# Patient Record
Sex: Female | Born: 1985 | Hispanic: No | Marital: Single | Smoking: Never smoker
Health system: Southern US, Community
[De-identification: ages and names within clinical notes are randomized; demographics above are authoritative.]

## PROBLEM LIST (undated history)

## (undated) DIAGNOSIS — F410 Panic disorder [episodic paroxysmal anxiety] without agoraphobia: Secondary | ICD-10-CM

## (undated) DIAGNOSIS — F329 Major depressive disorder, single episode, unspecified: Secondary | ICD-10-CM

## (undated) DIAGNOSIS — F32A Depression, unspecified: Secondary | ICD-10-CM

## (undated) HISTORY — PX: APPENDECTOMY: SHX54

## (undated) HISTORY — DX: Depression, unspecified: F32.A

## (undated) HISTORY — DX: Major depressive disorder, single episode, unspecified: F32.9

---

## 2013-12-17 ENCOUNTER — Emergency Department (HOSPITAL_COMMUNITY)
Admission: EM | Admit: 2013-12-17 | Discharge: 2013-12-17 | Disposition: A | Discharge status: Home / Self Care | Payer: Managed Care, Other (non HMO) | Attending: Emergency Medicine | Admitting: Emergency Medicine

## 2013-12-17 ENCOUNTER — Encounter (HOSPITAL_COMMUNITY): Payer: Self-pay | Admitting: Emergency Medicine

## 2013-12-17 ENCOUNTER — Emergency Department (HOSPITAL_COMMUNITY): Payer: Managed Care, Other (non HMO)

## 2013-12-17 DIAGNOSIS — Z8659 Personal history of other mental and behavioral disorders: Secondary | ICD-10-CM | POA: Insufficient documentation

## 2013-12-17 DIAGNOSIS — R51 Headache: Secondary | ICD-10-CM | POA: Insufficient documentation

## 2013-12-17 DIAGNOSIS — Z3202 Encounter for pregnancy test, result negative: Secondary | ICD-10-CM | POA: Insufficient documentation

## 2013-12-17 DIAGNOSIS — G8929 Other chronic pain: Secondary | ICD-10-CM | POA: Insufficient documentation

## 2013-12-17 DIAGNOSIS — R6884 Jaw pain: Secondary | ICD-10-CM | POA: Insufficient documentation

## 2013-12-17 DIAGNOSIS — R079 Chest pain, unspecified: Secondary | ICD-10-CM

## 2013-12-17 DIAGNOSIS — R0789 Other chest pain: Secondary | ICD-10-CM | POA: Insufficient documentation

## 2013-12-17 DIAGNOSIS — R002 Palpitations: Secondary | ICD-10-CM | POA: Insufficient documentation

## 2013-12-17 DIAGNOSIS — R0602 Shortness of breath: Secondary | ICD-10-CM | POA: Insufficient documentation

## 2013-12-17 HISTORY — DX: Panic disorder (episodic paroxysmal anxiety): F41.0

## 2013-12-17 LAB — BASIC METABOLIC PANEL
BUN: 12 mg/dL (ref 6–23)
CHLORIDE: 102 meq/L (ref 96–112)
CO2: 27 mEq/L (ref 19–32)
CREATININE: 0.64 mg/dL (ref 0.50–1.10)
Calcium: 10.5 mg/dL (ref 8.4–10.5)
GFR calc Af Amer: >90 mL/min (ref >90)
GFR calc non Af Amer: >90 mL/min (ref >90)
Glucose, Bld: 94 mg/dL (ref 70–99)
Potassium: 4.5 mEq/L (ref 3.7–5.3)
Sodium: 141 mEq/L (ref 137–147)

## 2013-12-17 LAB — POCT I-STAT TROPONIN I: TROPONIN I, POC: 0.01 ng/mL (ref 0.00–0.08)

## 2013-12-17 LAB — CBC
HEMATOCRIT: 38.5 % (ref 36.0–46.0)
Hemoglobin: 12.8 g/dL (ref 12.0–15.0)
MCH: 29.5 pg (ref 26.0–34.0)
MCHC: 33.2 g/dL (ref 30.0–36.0)
MCV: 88.7 fL (ref 78.0–100.0)
Platelets: 279 10*3/uL (ref 150–400)
RBC: 4.34 MIL/uL (ref 3.87–5.11)
RDW: 13.8 % (ref 11.5–15.5)
WBC: 6.8 10*3/uL (ref 4.0–10.5)

## 2013-12-17 LAB — POCT PREGNANCY, URINE: Preg Test, Ur: NEGATIVE

## 2013-12-17 LAB — D-DIMER, QUANTITATIVE: D-Dimer, Quant: <0.27 ug/mL-FEU (ref 0.00–0.48)

## 2013-12-17 NOTE — ED Provider Notes (Signed)
CSN: [id number]     Arrival date & time 12/17/13  0807 History   First MD Initiated Contact with Patient 12/17/13 [phone]     Chief Complaint  Patient presents with  . Chest Pain  . Shortness of Breath     (Consider location/radiation/quality/duration/timing/severity/associated sxs/prior Treatment) HPI Comments: 28 year old female presents with 2 weeks of intermittent chest pain and shortness of breath. She states the pain is in her left upper chest and feels like a sharp stabbing pain. He comes on about 10 minutes and then is completely resolved. This happens a couple times a day the past couple weeks nearly every day. She states this morning she woke up with palpitations and had palpitations yesterday associated with these symptoms. She also felt like the pain was in her jaw. The symptoms have now resolved. She's not had any cough, fever, or rhinorrhea. She has not been recently ill. She's never had a history of a blood clot, has no leg pain or no leg swelling. Denies weight changes. No family history of cardiac disease. No prior medical problems with the patient and his never smoked. No recent travel to outside countries.   Past Medical History  Diagnosis Date  . Panic attack    History reviewed. No pertinent past surgical history. No family history on file. History  Substance Use Topics  . Smoking status: Never Smoker   . Smokeless tobacco: Not on file  . Alcohol Use: No   OB History   Grav Para Term Preterm Abortions TAB SAB Ect Mult Living                 Review of Systems  Constitutional: Negative for fever, fatigue and unexpected weight change.  HENT: Negative for congestion and rhinorrhea.   Respiratory: Positive for shortness of breath. Negative for cough.   Cardiovascular: Positive for chest pain. Negative for leg swelling.  Gastrointestinal: Negative for abdominal pain.  Neurological: Positive for headaches (chronic headaches, none now).  All other systems reviewed and  are negative.      Allergies  Review of patient's allergies indicates no known allergies.  Home Medications   Current Outpatient Rx  Name  Route  Sig  Dispense  Refill  . acetaminophen (TYLENOL) 325 MG tablet   Oral   Take 650 mg by mouth every 6 (six) hours as needed for mild pain.          BP 147/70  Pulse 80  Temp(Src) 98 F (36.7 C) (Oral)  Resp 17  SpO2 100%  LMP 12/13/2013 Physical Exam  Nursing note and vitals reviewed. Constitutional: She is oriented to person, place, and time. She appears well-developed and well-nourished. No distress.  HENT:  Head: Normocephalic and atraumatic.  Right Ear: External ear normal.  Left Ear: External ear normal.  Nose: Nose normal.  Eyes: Right eye exhibits no discharge. Left eye exhibits no discharge.  Cardiovascular: Normal rate, regular rhythm and normal heart sounds.   No murmur heard. Pulmonary/Chest: Effort normal and breath sounds normal. She exhibits no tenderness.  Abdominal: Soft. She exhibits no distension. There is no tenderness.  Musculoskeletal: She exhibits no edema and no tenderness.  No signs of asymmetric leg swelling or tenderness  Neurological: She is alert and oriented to person, place, and time.  Skin: Skin is warm and dry.    ED Course  Procedures (including critical care time) Labs Review Labs Reviewed  CBC  BASIC METABOLIC PANEL  D-DIMER, QUANTITATIVE   Imaging Review Dg Chest 2  View  12/17/2013   CLINICAL DATA:  Chest pain and shortness of breath. Left upper anterior and posterior chest pain for 2 weeks.  EXAM: CHEST  2 VIEW  COMPARISON:  None.  FINDINGS: The cardiomediastinal silhouette is within normal limits. The lungs are well inflated and clear. There is no evidence of pleural effusion or pneumothorax. No acute osseous abnormality is identified.  IMPRESSION: Unremarkable appearance of the chest.   Electronically Signed   By: Sebastian AcheAllen  Grady   On: 12/17/2013 09:11    EKG Interpretation     Date/Time:  Wednesday December 17 2013 08:15:54 EST Ventricular Rate:  74 PR Interval:  143 QRS Duration: 85 QT Interval:  368 QTC Calculation: 408 R Axis:   91 Text Interpretation:  Sinus rhythm Borderline right axis deviation Baseline wander in lead(s) II III aVR aVF V1 V2 V4 V5 V6 given the wander, no acute ischemia noted No old tracing to compare Confirmed by Jeren Dufrane  MD, Tiki Tucciarone ([id number]) on 12/17/2013 8:23:11 AM            MDM   Final diagnoses:  Chest pain  Palpitations    Patient is well appearing here, no current complaints. Very low risk for ACS, and with benign EKG and troponin here I feel this is excluded. She did recently move from [place] so there is some concern for PE, ddimer sent bc she is low risk. Given palpitations she will need further w/u and possibly holter monitor. Will recommend getting a new PCP in this area for this. No outward signs of thyroid disease on exam.     Audree CamelScott T Idalia Allbritton, MD 12/17/13 1055

## 2013-12-17 NOTE — ED Notes (Signed)
Pt reports left sided CP/SOB for 2 weeks. Awoke yesterday with radiating pain to jaw, neck and back pain lasting 10 minutes. Nothing made it stop. HX of panic attacks only. No other complaints

## 2013-12-17 NOTE — Discharge Instructions (Signed)
Your caregiver has diagnosed you as having chest pain that is not specific for one problem, but does not require admission.  You are at low risk for an acute heart condition or other serious illness. Chest pain comes from many different causes.  °SEEK IMMEDIATE MEDICAL ATTENTION IF: °You have severe chest pain, especially if the pain is crushing or pressure-like and spreads to the arms, back, neck, or jaw, or if you have sweating, nausea (feeling sick to your stomach), or shortness of breath. THIS IS AN EMERGENCY. Don't wait to see if the pain will go away. Get medical help at once. Call 911 or 0 (operator). DO NOT drive yourself to the hospital.  °Your chest pain gets worse and does not go away with rest.  °You have an attack of chest pain lasting longer than usual, despite rest and treatment with the medications your caregiver has prescribed.  °You wake from sleep with chest pain or shortness of breath.  °You feel dizzy or faint.  °You have chest pain not typical of your usual pain for which you originally saw your caregiver. ° ° °Emergency Department Resource Guide °1) Find a Doctor and Pay Out of Pocket °Although you won't have to find out who is covered by your insurance plan, it is a good idea to ask around and get recommendations. You will then need to call the office and see if the doctor you have chosen will accept you as a new patient and what types of options they offer for patients who are self-pay. Some doctors offer discounts or will set up payment plans for their patients who do not have insurance, but you will need to ask so you aren't surprised when you get to your appointment. ° °2) Contact Your Local Health Department °Not all health departments have doctors that can see patients for sick visits, but many do, so it is worth a call to see if yours does. If you don't know where your local health department is, you can check in your phone book. The CDC also has a tool to help you locate your state's  health department, and many state websites also have listings of all of their local health departments. ° °3) Find a Walk-in Clinic °If your illness is not likely to be very severe or complicated, you may want to try a walk in clinic. These are popping up all over the country in pharmacies, drugstores, and shopping centers. They're usually staffed by nurse practitioners or physician assistants that have been trained to treat common illnesses and complaints. They're usually fairly quick and inexpensive. However, if you have serious medical issues or chronic medical problems, these are probably not your best option. ° °No Primary Care Doctor: °- Call [facility] at  832-8000 - they can help you locate a primary care doctor that  accepts your insurance, provides certain services, etc. °- Physician Referral Service- 1-800-533-3463 ° °Chronic Pain Problems: °Organization         Address  Phone   Notes  °[facility]  (336) 297-2271 Patients need to be referred by their primary care doctor.  ° °Medication Assistance: °Organization         Address  Phone   Notes  °[place] [facility] [address] °[place], [place] [address] (336) 641-8030 --Must be a resident of [place] °-- Must have NO insurance coverage whatsoever (no Medicaid/ Medicare, etc.) °-- The pt. MUST have a primary care doctor that   directs their care regularly and follows them in the community °  °[facility]  (866) 331-1348   °[employer]  (888) 892-1162   ° °Agencies that provide inexpensive medical care: °Organization         Address  Phone   Notes  °[facility]  (336) 832-8035   °[facility]    (336) 832-7272   °[facility] [address] °[place], [place] [address] (336) 832-4777   °[facility] [address], °[place] (336) 271-4999   °[facility]    (336) 373-0678   °[facility]    (336) 272-1050    °[facility] ° [address], [place] Phone:  (336) 832-4444, Fax:  (336) 832-4440 Hours of Operation:  9 am - 6 pm, M-F.  Also accepts Medicaid/Medicare and self-pay.  °[facility] ° [address], [place] Phone: (336) 832-3150, Fax: (336) 832-3151. Hours of Operation:  8:30 am - 5:30 pm, M-F.  Also accepts Medicaid and self-pay.  °[facility] [place] [address], [place] Phone: (336) 878-6027   °[facility] [address], [place], [place] (336)723-1848, Ext. 123 Mondays & Thursdays: 7-9 AM.  First 15 patients are seen on a first come, first serve basis. °  ° °Medicaid-accepting [place] Providers: ° °Organization         Address  Phone   Notes  °[facility] [address], [place] (336) 641-2100 Also accepts self-pay patients.  °[facility] [address], [place] ° (336) 856-9996   °[facility] [address], [place] (336) 288-8857   °[facility] [address] [place] [address], [place] (336) 299-7000   °[facility] [address], [place]  ° (336) 373-1557 Only accepts [place] Access Medicaid patients after they have their name applied to their card.  ° °Self-Pay (no insurance) in [place]: ° °Organization         Address  Phone   Notes  °Sickle Cell Patients, [facility] [address], [place] (336) 832-1970   °[facility] [address], [place] (336) 832-4400   °[facility] ° [address], [place] (336) 992-4800   °[facility]/Dr. Osei-Bonsu ° [address] [place] [address], [place] or [address], [place] (336) 841-8500 Phone number for both [place] and [place] locations is the same.  °[facility] [address], [place] (336) 299-0000     °[facility] [address] [place] [address], [place] or [address] (336) 852-7530 °(336) 878-2260   °[facility] [address], [place] (336) 350-1642, phone; (336) 294-5005, fax Sees patients 1st and 3rd Saturday of every month.  Must not qualify for public or private insurance (i.e. Medicaid, Medicare, [place] Health Choice, Veterans' Benefits) • Household income should be no more than 200% of the poverty level •The clinic cannot treat you if you are pregnant or think you are pregnant • Sexually transmitted diseases are not treated at the clinic.  ° ° °Dental Care: °Organization         Address  Phone  Notes  °[facility] [address], [place] (336) 641-6152 Accepts children   up to age 21 who are enrolled in Medicaid or [place] Health Choice; pregnant women with a Medicaid card; and children who have applied for Medicaid or [place] Health Choice, but were declined, whose parents can pay a reduced fee at time of service.  °[facility] [place]  [address], [place] (336) 641-7733 Accepts children up to age 21 who are enrolled in Medicaid or [place] Health Choice; pregnant women with a Medicaid card; and children who have applied for Medicaid or [place] Health Choice, but were declined, whose parents can pay a reduced fee at time of service.  °[facility] ° [address], [place] (336) 641-4533 Patients are seen by appointment only. Walk-ins are not accepted. [facility] will see patients 18 years of age and older. °Monday - Tuesday (8am-5pm) °Most Wednesdays (8:30-5pm) °$30 per visit, cash only  °[facility] ° [address], [place] (336) 641-4533 Patients are seen by appointment only. Walk-ins are not accepted. [facility] will see patients 18 years of age and older. °One Wednesday Evening (Monthly: Volunteer Based).   $30 per visit, cash only  °[facility]  (919) 537-3737 for adults; Children under age 4, call Graduate Pediatric Dentistry at (919) [phone]. Children aged 4-14, please call (919) [phone] to request a pediatric application. ° Dental services are provided in all areas of dental care including fillings, crowns and bridges, complete and partial dentures, implants, gum treatment, root canals, and extractions. Preventive care is also provided. Treatment is provided to both adults and children. °Patients are selected via a lottery and there is often a waiting list. °  °[facility] [address], °[place] ° (336) 763-8833 [email or web address] °  °[facility] [address], [place], [place] (336)723-1848, Ext. 123 Second and Fourth Thursday of each month, opens at 6:30 AM; Clinic ends at 9 AM.  Patients are seen on a first-come first-served basis, and a limited number are seen during each clinic.  ° °[facility] ° [address], [place], [place] (336) 723-7904   Eligibility Requirements °You must have lived in [place], [place], or [place] counties for at least the last three months. °  You cannot be eligible for state or federal sponsored healthcare insurance, including Veterans Administration, Medicaid, or Medicare. °  You generally cannot be eligible for healthcare insurance through your employer.  °  How to apply: °Eligibility screenings are held every Tuesday and Wednesday afternoon from 1:00 pm until 4:00 pm. You do not need an appointment for the interview!  °[facility] [address], [place], [place] 336-631-2330   °[facility]  336-342-8273   °[facility]  336-703-3100   °[facility]  336-570-6415   ° °Behavioral Health Resources in the Community: °Intensive Outpatient Programs °Organization         Address  Phone  Notes  °[facility] [address], [place], [place] 336-878-6098   °[facility] [address], [place], [place] 336-832-9800   °[facility] [address], [place], [place] ° 336-882-2125   °[facility] [address],  °, [place] 1-800-853-5163 or 336-641-4981   °Substance Abuse Resources °Organization         Address  Phone  Notes  °[facility]  336-882-2125   °[facility]  336-784-9470   °[facility]  336-285-9073   °[facility]  336-845-3988   °Residential & Outpatient Substance Abuse Program  1-800-659-3381   °Psychological Services °Organization         Address  Phone  Notes  °[facility]  336- 832-9600   °[facility]  336- 378-7881   °[facility] [address], [place] 1-800-853-5163 or 336-641-4981   ° °Mobile Crisis Teams °Organization         Address  Phone  Notes  °[facility], Mobile Crisis Care Unit  1-877-626-1772   °Assertive °[facility] ° [address] [place], [place] 336-834-9664   °Sharon DeEsch [address] °[place] 336-554-5454   ° °Self-Help/Support Groups °Organization         Address  Phone             Notes  °[facility] [place] - variety of support groups  336- 373-1402 Call for more information  °Narcotics Anonymous (NA), [facility] [address], °[place]  2 meetings at this location  ° °Residential Treatment Programs °Organization         Address  Phone  Notes  °[facility] [address],    °[place]  1-866-801-8205   °[facility] ° [address], [place], [place] 704-293-8524   °[facility] [address], [place] 336-845-3988 Admissions: 8am-3pm M-F  °[facility] [address]    °[place], [place] 336-841-1104   °[facility] [address], [place], [place] 336-379-7146   °[facility] [address]  °[place], [place] 336-285-9073   °[facility] Intensive Outpatient [address], [place], [place] 336-852-3033   °[facility] [address]  °[place], [place] 1-877-615-2722 or 336-784-9470   °[facility] ([facility] [address] [place], [place] 336-227-7417 Accepts Medicaid  °[facility] [address]  °[place] 1-800-659-3381 Substance Abuse/Addiction Treatment  ° °[place] Behavioral Health Resources °Organization         Address  Phone  Notes  °[facility]  (888) 581-9988   °Julie Brannon, PhD [address] [place], [place]   (336) 349-5553 or (336) 951-0000   °[facility]   [address] °[place], [place] (336) 349-4454   °[facility] [address], [place], [place] (336) 342-8316 Insurance/Medicaid/sponsorship through [facility]  °[facility] [address]                                    [place], [place] (336) 342-8316 Therapy/tele-psych/case  °[facility] [address]  ° [place], [place] (336) 349-2233    °Dr. Arfeen  (336) 349-4544   °[facility] [place]  [employer] [place] [facility] 1) [address],  °2) [address], [place] °3)  [address], [place] (336) 349-3220 °(336) 342-7768 ° °(336) 342-8140   °[place] Child Abuse [facility] (336) 342-1394 or (336) 342-3537 (After Hours)    ° ° ° °

## 2013-12-17 NOTE — ED Notes (Signed)
MD at bedside. 

## 2014-01-08 ENCOUNTER — Ambulatory Visit (INDEPENDENT_AMBULATORY_CARE_PROVIDER_SITE_OTHER): Payer: Managed Care, Other (non HMO) | Admitting: Emergency Medicine

## 2014-01-08 ENCOUNTER — Ambulatory Visit: Payer: Managed Care, Other (non HMO)

## 2014-01-08 VITALS — BP 120/66 | HR 83 | Temp 98.3°F | Resp 18 | Ht 63.0 in | Wt 136.2 lb

## 2014-01-08 DIAGNOSIS — M545 Low back pain, unspecified: Secondary | ICD-10-CM

## 2014-01-08 DIAGNOSIS — M5430 Sciatica, unspecified side: Secondary | ICD-10-CM

## 2014-01-08 LAB — POCT UA - MICROSCOPIC ONLY
Casts, Ur, LPF, POC: NEGATIVE
Crystals, Ur, HPF, POC: NEGATIVE
Yeast, UA: NEGATIVE

## 2014-01-08 LAB — POCT URINALYSIS DIPSTICK
BILIRUBIN UA: NEGATIVE
GLUCOSE UA: NEGATIVE
Ketones, UA: NEGATIVE
Leukocytes, UA: NEGATIVE
Nitrite, UA: NEGATIVE
Protein, UA: NEGATIVE
Urobilinogen, UA: 0.2
pH, UA: 6

## 2014-01-08 LAB — POCT URINE PREGNANCY: PREG TEST UR: NEGATIVE

## 2014-01-08 MED ORDER — CYCLOBENZAPRINE HCL 5 MG PO TABS: 5.0000 mg | ORAL_TABLET | Freq: Every day | ORAL | Status: DC

## 2014-01-08 MED ORDER — MELOXICAM 7.5 MG PO TABS: 7.5000 mg | ORAL_TABLET | Freq: Every day | ORAL | Status: DC

## 2014-01-08 NOTE — Patient Instructions (Signed)
Sciatica °Sciatica is pain, weakness, numbness, or tingling along the path of the sciatic nerve. The nerve starts in the lower back and runs down the back of each leg. The nerve controls the muscles in the lower leg and in the back of the knee, while also providing sensation to the back of the thigh, lower leg, and the sole of your foot. Sciatica is a symptom of another medical condition. For instance, nerve damage or certain conditions, such as a herniated disk or bone spur on the spine, pinch or put pressure on the sciatic nerve. This causes the pain, weakness, or other sensations normally associated with sciatica. Generally, sciatica only affects one side of the body. °CAUSES  °· Herniated or slipped disc. °· Degenerative disk disease. °· A pain disorder involving the narrow muscle in the buttocks (piriformis syndrome). °· Pelvic injury or fracture. °· Pregnancy. °· Tumor (rare). °SYMPTOMS  °Symptoms can vary from mild to very severe. The symptoms usually travel from the low back to the buttocks and down the back of the leg. Symptoms can include: °· Mild tingling or dull aches in the lower back, leg, or hip. °· Numbness in the back of the calf or sole of the foot. °· Burning sensations in the lower back, leg, or hip. °· Sharp pains in the lower back, leg, or hip. °· Leg weakness. °· Severe back pain inhibiting movement. °These symptoms may get worse with coughing, sneezing, laughing, or prolonged sitting or standing. Also, being overweight may worsen symptoms. °DIAGNOSIS  °Your caregiver will perform a physical exam to look for common symptoms of sciatica. He or she may ask you to do certain movements or activities that would trigger sciatic nerve pain. Other tests may be performed to find the cause of the sciatica. These may include: °· Blood tests. °· X-rays. °· Imaging tests, such as an MRI or CT scan. °TREATMENT  °Treatment is directed at the cause of the sciatic pain. Sometimes, treatment is not necessary  and the pain and discomfort goes away on its own. If treatment is needed, your caregiver may suggest: °· Over-the-counter medicines to relieve pain. °· Prescription medicines, such as anti-inflammatory medicine, muscle relaxants, or narcotics. °· Applying heat or ice to the painful area. °· Steroid injections to lessen pain, irritation, and inflammation around the nerve. °· Reducing activity during periods of pain. °· Exercising and stretching to strengthen your abdomen and improve flexibility of your spine. Your caregiver may suggest losing weight if the extra weight makes the back pain worse. °· Physical therapy. °· Surgery to eliminate what is pressing or pinching the nerve, such as a bone spur or part of a herniated disk. °HOME CARE INSTRUCTIONS  °· Only take over-the-counter or prescription medicines for pain or discomfort as directed by your caregiver. °· Apply ice to the affected area for 20 minutes, 3 4 times a day for the first 48 72 hours. Then try heat in the same way. °· Exercise, stretch, or perform your usual activities if these do not aggravate your pain. °· Attend physical therapy sessions as directed by your caregiver. °· Keep all follow-up appointments as directed by your caregiver. °· Do not wear high heels or shoes that do not provide proper support. °· Check your mattress to see if it is too soft. A firm mattress may lessen your pain and discomfort. °SEEK IMMEDIATE MEDICAL CARE IF:  °· You lose control of your bowel or bladder (incontinence). °· You have increasing weakness in the lower back,   pelvis, buttocks, or legs. °· You have redness or swelling of your back. °· You have a burning sensation when you urinate. °· You have pain that gets worse when you lie down or awakens you at night. °· Your pain is worse than you have experienced in the past. °· Your pain is lasting longer than 4 weeks. °· You are suddenly losing weight without reason. °MAKE SURE YOU: °· Understand these  instructions. °· Will watch your condition. °· Will get help right away if you are not doing well or get worse. °Document Released: 10/10/2001 Document Revised: 04/16/2012 Document Reviewed: 02/25/2012 °[employer]® Patient Information ©2014 [employer]. ° °

## 2014-01-08 NOTE — Progress Notes (Signed)
Subjective:    Patient ID: Bailey Ballard, female    DOB: 09/15/1986, 28 y.o.   MRN: [id number] This chart was scribed for Lesle Chris, MD by Marica Otter, ED Scribe. This patient was seen in room 12 and the patient's care was started at 1:32PM.    No primary provider on file.  HPI HPI Comments: Bailey Ballard is a 28 y.o. female who presents to the [facility] complaining of intermittent back pain that radiates through the back of her leg, onset one week ago. Pt reports associated numbness with each episode of pain. Pt further states that she is unable to walk during each episode. Pt states she did not have any direct injury to the area; Pt also states she does not exercise. Pt denies dysuria. 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   Review of Systems  Constitutional: Negative for fatigue and unexpected weight change.  Respiratory: Negative for chest tightness and shortness of breath.   Cardiovascular: Negative for chest pain, palpitations and leg swelling.  Gastrointestinal: Negative for abdominal pain and blood in stool.  Genitourinary: Negative for dysuria.  Musculoskeletal: Positive for back pain (radiates to the back of the legs).  Neurological: Positive for numbness (in the legs). Negative for dizziness, syncope, light-headedness and headaches.    Objective:   Physical Exam  Nursing note and vitals reviewed.  CONSTITUTIONAL: Well developed/well nourished HEAD: Normocephalic/atraumatic EYES: EOMI/PERRL ENMT: Mucous membranes moist NECK: supple no meningeal signs SPINE:entire spine nontender CV: S1/S2 noted, no murmurs/rubs/gallops noted LUNGS: Lungs are clear to auscultation bilaterally, no apparent distress ABDOMEN: soft, nontender, no rebound or guarding GU:no cva tenderness NEURO: Pt is awake/alert, moves all extremitiesx4. Straight leg raise positive.  EXTREMITIES: pulses normal, full ROM SKIN: warm, color normal PSYCH: no abnormalities of mood  noted MUSC: Mild tenderness on SI joint on the left.  Results for orders placed in visit on 01/08/14  POCT UA - MICROSCOPIC ONLY      Result Value Ref Range   WBC, Ur, HPF, POC 0-3     RBC, urine, microscopic 0-2     Bacteria, U Microscopic trace     Mucus, UA small     Epithelial cells, urine per micros 2-8     Crystals, Ur, HPF, POC neg     Casts, Ur, LPF, POC neg     Yeast, UA neg    POCT URINALYSIS DIPSTICK      Result Value Ref Range   Color, UA yellow     Clarity, UA clear     Glucose, UA neg     Bilirubin, UA neg     Ketones, UA neg     Spec Grav, UA >=1.030     Blood, UA tr-lysed     pH, UA 6.0     Protein, UA neg     Urobilinogen, UA 0.2     Nitrite, UA neg     Leukocytes, UA Negative    POCT URINE PREGNANCY      Result Value Ref Range   Preg Test, Ur Negative    [facility] reading (PRIMARY) by  Dr. Cleta Alberts no abnormality seen no fracture seen   Filed Vitals:   01/08/14 1325  BP: 120/66  Pulse: 83  Temp: 98.3 F (36.8 C)  TempSrc: Oral  Resp: 18  Height: 5\' 3"  (1.6 m)  Weight: 136 lb 4 oz (61.803 kg)  SpO2: 100%   Assessment & Plan:  Patient has signs and symptoms consistent  with sciatica. She has pain in her back and down the left leg. Her x-rays are normal we'll treat with Flexeril 5 mg at bedtime and Mobic 7.5 mg in the morning I personally performed the services described in this documentation, which was scribed in my presence. The recorded information has been reviewed and is accurate.

## 2014-03-03 ENCOUNTER — Ambulatory Visit: Payer: Managed Care, Other (non HMO) | Admitting: Advanced Practice Midwife

## 2014-03-10 ENCOUNTER — Encounter: Payer: Self-pay | Admitting: Advanced Practice Midwife

## 2014-03-10 ENCOUNTER — Ambulatory Visit (INDEPENDENT_AMBULATORY_CARE_PROVIDER_SITE_OTHER): Payer: Managed Care, Other (non HMO) | Admitting: Advanced Practice Midwife

## 2014-03-10 VITALS — BP 118/74 | HR 89 | Temp 98.1°F | Ht 63.0 in | Wt 139.0 lb

## 2014-03-10 DIAGNOSIS — N926 Irregular menstruation, unspecified: Secondary | ICD-10-CM

## 2014-03-10 DIAGNOSIS — N939 Abnormal uterine and vaginal bleeding, unspecified: Secondary | ICD-10-CM

## 2014-03-10 NOTE — Progress Notes (Signed)
Bailey Ballard is a 28 y.o.who presents for irregular menses. Patient's last menstrual period was 02/02/2014. Menarche age: [age]. Periods are regular every 21 days, lasting 5 days. Dysmenorrhea:none. Cyclic symptoms include: none. Current contraception: abstinence.History of infertility: no. History of abnormal Pap smear: no.  Patient has not been sexually active to date. She is not at risk of +HPV. She has not had a breast exam or pelvic to date.   She presents today because she missed her menses in March and April which was abnormal for her. She has also had recent hair loss.  Bailey DecSara recently moved from [place] for school. She is originally from [place] and her family is still there. She is under a lot of stress because she does not know anyone and has no friends. She has started a new school program.   Patient denies abnormal hair growth, cyclic pelvic pain, recent weight gain or loss, denies other symptoms.   There are no active problems to display for this patient.  Past Medical History  Diagnosis Date  . Panic attack     Past Surgical History  Procedure Laterality Date  . Appendectomy      Current outpatient prescriptions:acetaminophen (TYLENOL) 325 MG tablet, Take 650 mg by mouth every 6 (six) hours as needed for mild pain., Disp: , Rfl:  No Known Allergies  History  Substance Use Topics  . Smoking status: Never Smoker   . Smokeless tobacco: Never Used  . Alcohol Use: No    History reviewed. No pertinent family history.   Review of Systems Constitutional: negative for fatigue and weight loss Respiratory: negative for cough and wheezing Cardiovascular: negative for chest pain, fatigue and palpitations Gastrointestinal: negative for abdominal pain and change in bowel habits Genitourinary:negative Integument/breast: negative for nipple discharge Musculoskeletal:negative for myalgias Neurological: negative for gait problems and tremors Behavioral/Psych: negative for  abusive relationship, depression Endocrine: negative for temperature intolerance     Lab Review Urine pregnancy test Labs reviewed yes Radiologic studies reviewed no  Objective:  BP 118/74  Pulse 89  Temp(Src) 98.1 F (36.7 C)  Ht 5\' 3"  (1.6 m)  Wt 139 lb (63.05 kg)  BMI 24.63 kg/m2  LMP 02/27/2014 General:   alert  Skin:   no rash or abnormalities    Assessment:    The patient has amenorrhea x2 months, currently resolved BCM: Abstinence Recent life stress   Plan:    All questions answered. Agricultural engineerducational material distributed. Follow up in 2 months.  Patient declined BCM to help w/ abnormal bleeding. We will do a annual exam in 2 months and reevaluate bleeding. Patient would like to avoid medicaiton at this time. Discussed recent stress and methods to help improve stress. Offer journeys therapy if patient desires NV.   No orders of the defined types were placed in this encounter.   No orders of the defined types were placed in this encounter.   Need to obtain previous records, reviewed today Possible management options include:Birth control Follow up as needed. Janise Gora Wilson SingerWren CNM

## 2014-03-16 ENCOUNTER — Encounter: Payer: Self-pay | Admitting: General Surgery

## 2014-03-16 ENCOUNTER — Ambulatory Visit (INDEPENDENT_AMBULATORY_CARE_PROVIDER_SITE_OTHER): Payer: Managed Care, Other (non HMO) | Admitting: Cardiology

## 2014-03-16 ENCOUNTER — Encounter: Payer: Self-pay | Admitting: Cardiology

## 2014-03-16 VITALS — BP 116/60 | HR 88 | Ht 63.0 in | Wt 138.0 lb

## 2014-03-16 DIAGNOSIS — R011 Cardiac murmur, unspecified: Secondary | ICD-10-CM

## 2014-03-16 DIAGNOSIS — R0789 Other chest pain: Secondary | ICD-10-CM

## 2014-03-16 LAB — D-DIMER, QUANTITATIVE (NOT AT ARMC)

## 2014-03-16 NOTE — Patient Instructions (Addendum)
Your physician recommends that you continue on your current medications as directed. Please refer to the Current Medication list given to you today.  Your physician recommends that you go to the lab today for a D-Dimer  Your physician has requested that you have an echocardiogram. Echocardiography is a painless test that uses sound waves to create images of your heart. It provides your doctor with information about the size and shape of your heart and how well your heart's chambers and valves are working. This procedure takes approximately one hour. There are no restrictions for this procedure.  Your physician has requested that you have an exercise tolerance test. For further information please visit [email or web address]. Please also follow instruction sheet, as given.  Your physician recommends that you schedule a follow-up appointment As Needed

## 2014-03-16 NOTE — Progress Notes (Signed)
  [address] [place]  [address] Phone: [phone] Fax:  [phone]  Date:  03/16/2014   ID:  Bailey LarsenSara Ballard, DOB [birth year], MRN [id number]  PCP:  No primary provider on file.  Cardiologist:  Armanda Magicraci TUrner, MD     History of Present Illness: Bailey LarsenSara Ballard is a 28 y.o. female with a history of panic attacks who presents today for evaluation of chest pain.  She says that this has been occurring for about 3 months and occurs 1-2 times weekly.  It usually lasts about 10-15 minutes and then resolves but she may have it intermittent throughout the day for 3-4 days at a time.  She describes it as a stabbing pain that is over her left breast and left scapula.  She also has been getting SOB that occurs with exertion but no with the CP.  She denies any diaphoresis or nausea with the pain.  Nothing makes the pain better or worse and is nonexertional.  She also has been having problems with waking up in the middle of the night with her heart racing.  She has had some LE edema.  She denies any dizziness or syncope.   Wt Readings from Last 3 Encounters:  03/16/14 138 lb (62.596 kg)  03/10/14 139 lb (63.05 kg)  01/08/14 136 lb 4 oz (61.803 kg)     Past Medical History  Diagnosis Date  . Panic attack     Current Outpatient Prescriptions  Medication Sig Dispense Refill  . acetaminophen (TYLENOL) 325 MG tablet Take 650 mg by mouth every 6 (six) hours as needed for mild pain.       No current facility-administered medications for this visit.    Allergies:   No Known Allergies  Social History:  The patient  reports that she has never smoked. She has never used smokeless tobacco. She reports that she does not drink alcohol or use illicit drugs.   Family History:  The patient's family history is not on file.   ROS:  Please see the history of present illness.      All other systems reviewed and negative.   PHYSICAL EXAM: VS:  BP 116/60  Pulse 88  Ht 5\' 3"  (1.6 m)  Wt  138 lb (62.596 kg)  BMI 24.45 kg/m2  LMP 02/27/2014 Well nourished, well developed, in no acute distress HEENT: normal Neck: no JVD Cardiac:  normal S1, S2; RRR; 1/6 systolic heart murmur at RUSB when sitting upright Lungs:  clear to auscultation bilaterally, no wheezing, rhonchi or rales Abd: soft, nontender, no hepatomegaly Ext: no edema Skin: warm and dry Neuro:  CNs 2-12 intact, no focal abnormalities noted  EKG:  NSR with no nonspecific T wave changes  ASSESSMENT AND PLAN:  1. Chest pain that is atypical in a young female with no CRF.  EKG is nonischemic - I will check at ETT - check D-Dimer to rule out PE 2.  Faint systolic heart mumur - check 2D echo to assess murmur  Followup with me PRN  Signed, Armanda Magicraci Turner, MD 03/16/2014 2:37 PM

## 2014-03-18 ENCOUNTER — Other Ambulatory Visit: Payer: Self-pay

## 2014-03-18 ENCOUNTER — Ambulatory Visit (HOSPITAL_COMMUNITY)
Admission: RE | Admit: 2014-03-18 | Discharge: 2014-03-18 | Disposition: A | Discharge status: Home / Self Care | Payer: Managed Care, Other (non HMO) | Source: Ambulatory Visit | Attending: Cardiology | Admitting: Cardiology

## 2014-03-18 ENCOUNTER — Ambulatory Visit (HOSPITAL_BASED_OUTPATIENT_CLINIC_OR_DEPARTMENT_OTHER)
Admission: RE | Admit: 2014-03-18 | Discharge: 2014-03-18 | Disposition: A | Discharge status: Home / Self Care | Payer: Managed Care, Other (non HMO) | Source: Ambulatory Visit | Attending: Cardiology | Admitting: Cardiology

## 2014-03-18 DIAGNOSIS — R0789 Other chest pain: Secondary | ICD-10-CM

## 2014-03-18 DIAGNOSIS — R011 Cardiac murmur, unspecified: Secondary | ICD-10-CM | POA: Insufficient documentation

## 2014-03-18 NOTE — Progress Notes (Signed)
2D Echo Performed 03/18/2014    Othal Kubitz, RCS  

## 2014-03-19 ENCOUNTER — Telehealth: Payer: Self-pay | Admitting: Cardiology

## 2014-03-19 NOTE — Telephone Encounter (Signed)
Pt is aware.  

## 2014-03-19 NOTE — Telephone Encounter (Signed)
Please let patient know that echo was normal 

## 2014-03-20 ENCOUNTER — Telehealth: Payer: Self-pay | Admitting: Cardiology

## 2014-03-20 NOTE — Telephone Encounter (Signed)
Please let patient know that stress test was normal

## 2014-03-20 NOTE — Telephone Encounter (Signed)
**Note De-identified Bailey Ballard Obfuscation** LMTCB

## 2014-03-24 ENCOUNTER — Telehealth: Payer: Self-pay | Admitting: Cardiology

## 2014-03-24 NOTE — Telephone Encounter (Signed)
Patient would like her test results. Please call and advise.

## 2014-03-24 NOTE — Telephone Encounter (Signed)
Attempted to contact pt at number listed, voice mail not set up.

## 2014-03-24 NOTE — Telephone Encounter (Signed)
See phone note 03/24/14

## 2014-03-26 NOTE — Telephone Encounter (Signed)
Lm to call back for stress test and echo results.

## 2014-03-26 NOTE — Telephone Encounter (Signed)
Notified of stress test and echo results.  Spoke w/Wanda in nuclear who was able to access full echo results.  She did have complete Echo.  Dr. Mayford Knife reviewed as a normal study.

## 2014-06-12 ENCOUNTER — Ambulatory Visit: Payer: Managed Care, Other (non HMO) | Admitting: Advanced Practice Midwife

## 2014-06-15 ENCOUNTER — Ambulatory Visit: Payer: Managed Care, Other (non HMO) | Admitting: Obstetrics & Gynecology

## 2014-10-26 ENCOUNTER — Encounter: Payer: Self-pay | Admitting: *Deleted

## 2014-10-27 ENCOUNTER — Encounter: Payer: Self-pay | Admitting: Obstetrics & Gynecology

## 2015-02-16 ENCOUNTER — Emergency Department (HOSPITAL_COMMUNITY)
Admission: EM | Admit: 2015-02-16 | Discharge: 2015-02-16 | Disposition: A | Discharge status: Home / Self Care | Payer: PPO | Attending: Emergency Medicine | Admitting: Emergency Medicine

## 2015-02-16 ENCOUNTER — Encounter (HOSPITAL_COMMUNITY): Payer: Self-pay | Admitting: Emergency Medicine

## 2015-02-16 DIAGNOSIS — Z8659 Personal history of other mental and behavioral disorders: Secondary | ICD-10-CM | POA: Insufficient documentation

## 2015-02-16 DIAGNOSIS — R102 Pelvic and perineal pain: Secondary | ICD-10-CM | POA: Insufficient documentation
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
## 2015-02-16 NOTE — ED Notes (Signed)
Pt requesting vaginal exam due to possible hymen rupture.

## 2015-02-16 NOTE — Discharge Instructions (Signed)
Pelvic Pain Female pelvic pain can be caused by many different things and start from a variety of places. Pelvic pain refers to pain that is located in the lower half of the abdomen and between your hips. The pain may occur over a short period of time (acute) or may be reoccurring (chronic). The cause of pelvic pain may be related to disorders affecting the female reproductive organs (gynecologic), but it may also be related to the bladder, kidney stones, an intestinal complication, or muscle or skeletal problems. Getting help right away for pelvic pain is important, especially if there has been severe, sharp, or a sudden onset of unusual pain. It is also important to get help right away because some types of pelvic pain can be life threatening.  CAUSES  Below are only some of the causes of pelvic pain. The causes of pelvic pain can be in one of several categories.   Gynecologic.  Pelvic inflammatory disease.  Sexually transmitted infection.  Ovarian cyst or a twisted ovarian ligament (ovarian torsion).  Uterine lining that grows outside the uterus (endometriosis).  Fibroids, cysts, or tumors.  Ovulation.  Pregnancy.  Pregnancy that occurs outside the uterus (ectopic pregnancy).  Miscarriage.  Labor.  Abruption of the placenta or ruptured uterus.  Infection.  Uterine infection (endometritis).  Bladder infection.  Diverticulitis.  Miscarriage related to a uterine infection (septic abortion).  Bladder.  Inflammation of the bladder (cystitis).  Kidney stone(s).  Gastrointestinal.  Constipation.  Diverticulitis.  Neurologic.  Trauma.  Feeling pelvic pain because of mental or emotional causes (psychosomatic).  Cancers of the bowel or pelvis. EVALUATION  Your caregiver will want to take a careful history of your concerns. This includes recent changes in your health, a careful gynecologic history of your periods (menses), and a sexual history. Obtaining your family  history and medical history is also important. Your caregiver may suggest a pelvic exam. A pelvic exam will help identify the location and severity of the pain. It also helps in the evaluation of which organ system may be involved. In order to identify the cause of the pelvic pain and be properly treated, your caregiver may order tests. These tests may include:   A pregnancy test.  Pelvic ultrasonography.  An X-ray exam of the abdomen.  A urinalysis or evaluation of vaginal discharge.  Blood tests. HOME CARE INSTRUCTIONS   Only take over-the-counter or prescription medicines for pain, discomfort, or fever as directed by your caregiver.   Rest as directed by your caregiver.   Eat a balanced diet.   Drink enough fluids to make your urine clear or pale yellow, or as directed.   Avoid sexual intercourse if it causes pain.   Apply warm or cold compresses to the lower abdomen depending on which one helps the pain.   Avoid stressful situations.   Keep a journal of your pelvic pain. Write down when it started, where the pain is located, and if there are things that seem to be associated with the pain, such as food or your menstrual cycle.  Follow up with your caregiver as directed.  SEEK MEDICAL CARE IF:  Your medicine does not help your pain.  You have abnormal vaginal discharge. SEEK IMMEDIATE MEDICAL CARE IF:   You have heavy bleeding from the vagina.   Your pelvic pain increases.   You feel light-headed or faint.   You have chills.   You have pain with urination or blood in your urine.   You have uncontrolled diarrhea   or vomiting.   You have a fever or persistent symptoms for more than 3 days.  You have a fever and your symptoms suddenly get worse.   You are being physically or sexually abused.  MAKE SURE YOU:  Understand these instructions.  Will watch your condition.  Will get help if you are not doing well or get worse. Document Released:  09/12/2004 Document Revised: 03/02/2014 Document Reviewed: 02/05/2012 ExitCare Patient Information 2015 [employer]. This information is not intended to replace advice given to you by your health care provider. Make sure you discuss any questions you have with your health care provider.  

## 2015-02-18 ENCOUNTER — Other Ambulatory Visit: Payer: Self-pay | Admitting: Family Medicine

## 2015-02-18 ENCOUNTER — Ambulatory Visit
Admission: RE | Admit: 2015-02-18 | Discharge: 2015-02-18 | Disposition: A | Discharge status: Home / Self Care | Payer: PPO | Source: Ambulatory Visit | Attending: Family Medicine | Admitting: Family Medicine

## 2015-02-18 DIAGNOSIS — Z0000 Encounter for general adult medical examination without abnormal findings: Secondary | ICD-10-CM

## 2015-03-05 ENCOUNTER — Ambulatory Visit
Admission: RE | Admit: 2015-03-05 | Discharge: 2015-03-05 | Disposition: A | Discharge status: Home / Self Care | Payer: PPO | Source: Ambulatory Visit | Attending: Family Medicine | Admitting: Family Medicine

## 2015-03-05 ENCOUNTER — Other Ambulatory Visit: Payer: Self-pay | Admitting: Family Medicine

## 2015-03-05 DIAGNOSIS — Z111 Encounter for screening for respiratory tuberculosis: Secondary | ICD-10-CM

## 2016-07-18 ENCOUNTER — Other Ambulatory Visit: Payer: Self-pay | Admitting: Gastroenterology

## 2016-07-18 DIAGNOSIS — K5900 Constipation, unspecified: Secondary | ICD-10-CM

## 2016-07-18 DIAGNOSIS — R1084 Generalized abdominal pain: Secondary | ICD-10-CM

## 2016-07-25 ENCOUNTER — Other Ambulatory Visit: Payer: PPO

## 2016-08-01 ENCOUNTER — Other Ambulatory Visit: Payer: PPO

## 2016-08-01 ENCOUNTER — Ambulatory Visit
Admission: RE | Admit: 2016-08-01 | Discharge: 2016-08-01 | Disposition: A | Discharge status: Home / Self Care | Payer: PPO | Source: Ambulatory Visit | Attending: Gastroenterology | Admitting: Gastroenterology

## 2016-08-01 DIAGNOSIS — R1084 Generalized abdominal pain: Secondary | ICD-10-CM

## 2016-08-01 DIAGNOSIS — K5900 Constipation, unspecified: Secondary | ICD-10-CM

## 2016-08-18 ENCOUNTER — Telehealth: Payer: Self-pay | Admitting: Obstetrics and Gynecology

## 2016-08-18 NOTE — Telephone Encounter (Signed)
Called and left a message for patient to call back to schedule a new patient doctor referral. °

## 2016-08-23 ENCOUNTER — Encounter: Payer: Self-pay | Admitting: Obstetrics and Gynecology

## 2016-08-23 ENCOUNTER — Telehealth: Payer: Self-pay | Admitting: Obstetrics and Gynecology

## 2016-08-23 ENCOUNTER — Ambulatory Visit (INDEPENDENT_AMBULATORY_CARE_PROVIDER_SITE_OTHER): Payer: PPO | Admitting: Obstetrics and Gynecology

## 2016-08-23 VITALS — BP 118/66 | HR 84 | Resp 16 | Ht 63.75 in | Wt 122.0 lb

## 2016-08-23 DIAGNOSIS — R1084 Generalized abdominal pain: Secondary | ICD-10-CM | POA: Diagnosis not present

## 2016-08-23 DIAGNOSIS — R631 Polydipsia: Secondary | ICD-10-CM

## 2016-08-23 DIAGNOSIS — K5900 Constipation, unspecified: Secondary | ICD-10-CM

## 2016-08-23 DIAGNOSIS — N946 Dysmenorrhea, unspecified: Secondary | ICD-10-CM

## 2016-08-23 DIAGNOSIS — N920 Excessive and frequent menstruation with regular cycle: Secondary | ICD-10-CM | POA: Diagnosis not present

## 2016-08-23 LAB — FERRITIN: Ferritin: 6 ng/mL — ABNORMAL LOW (ref 10–154)

## 2016-08-23 LAB — CBC
HCT: 35.4 % (ref 35.0–45.0)
Hemoglobin: 11.4 g/dL — ABNORMAL LOW (ref 11.7–15.5)
MCH: 27.7 pg (ref 27.0–33.0)
MCHC: 32.2 g/dL (ref 32.0–36.0)
MCV: 86.1 fL (ref 80.0–100.0)
MPV: 9.9 fL (ref 7.5–12.5)
PLATELETS: 273 10*3/uL (ref 140–400)
RBC: 4.11 MIL/uL (ref 3.80–5.10)
RDW: 14.4 % (ref 11.0–15.0)
WBC: 4.4 10*3/uL (ref 3.8–10.8)

## 2016-08-23 MED ORDER — NORETHIN ACE-ETH ESTRAD-FE 1-20 MG-MCG PO TABS: 1.0000 | ORAL_TABLET | Freq: Every day | ORAL | 0 refills | Status: DC

## 2016-08-23 MED ORDER — NAPROXEN SODIUM 550 MG PO TABS: 550.0000 mg | ORAL_TABLET | Freq: Two times a day (BID) | ORAL | 2 refills | Status: DC

## 2016-08-23 NOTE — Telephone Encounter (Signed)
Called patient to schedule a three month recheck, per Dr. Oscar LaJertson. I could not leave a message because her voicemail box is full.

## 2016-08-23 NOTE — Patient Instructions (Signed)

## 2016-08-23 NOTE — Progress Notes (Signed)
30 y.o. G0P0000 Single [place] F here as a new patient for evaluation of abdominal pain.     Period Cycle (Days): 28 Period Duration (Days): 6 Period Pattern: Regular Menstrual Flow: Heavy Menstrual Control: Thin pad Menstrual Control Change Freq (Hours): every 20 min Dysmenorrhea: (!) Severe Dysmenorrhea Symptoms: Cramping, Throbbing, Other (Comment) (sharp abdominal pain; constipation; back pain, bloating, fatigue)  Her cycle has been this heavy for the last 4 months, previously was saturating a pad in an hour. She has had bad cramps since she started her cycle. Helped with ibuprofen. The last 2 months she has had severe, sharp, diffuse abdominal pain intermittently for 2 weeks, starting the week prior to her cycle. The pain, which is up to a 9/10 in severity, lasts for hours at a time. Hurts worse to move.   She has issues with constipation, long term. She denies any change in her constipation with the pain. Currently if she takes a stool softners she has a BM q d, prior 1 x a week. She does c/o increased urinary frequency, normal amounts. No urgency to void and no pain. She c/o excessive thirst in the last month. Dr Loreta Ave has seen her for abdominal pain and constipation and sent her here for evaluation of possible endometriosis.  She is here with her female "friend".  Never sexually active, declines vaginal exam secondary to her cultural beliefs.  Of note she is under lots of stress, c/o mild depression and anxiety.    Patient's last menstrual period was 08/11/2016.          Sexually active: No.  The current method of family planning is abstinence.    Exercising: Yes.    walking Smoker:  no  Health Maintenance: Pap:  Never per patinet History of abnormal Pap:  n/a MMG:  n/a Colonoscopy:  n/a BMD:   n/a TDaP:  unknown Gardasil: yes per patient   reports that she has never smoked. She has never used smokeless tobacco. She reports that she does not drink alcohol or use drugs.   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 Under lots of stress.   Past Medical History:  Diagnosis Date  . Depression   . Panic attack   Doing okay, no medications.   Past Surgical History:  Procedure Laterality Date  . APPENDECTOMY      Current Outpatient Prescriptions  Medication Sig Dispense Refill  . acetaminophen (TYLENOL) 325 MG tablet Take 650 mg by mouth every 6 (six) hours as needed for mild pain.    Marland Kitchen linaclotide (LINZESS) 290 MCG CAPS capsule Take 290 mcg by mouth daily before breakfast.    . Probiotic Product (PROBIOTIC PO) Take by mouth daily.     No current facility-administered medications for this visit.     History reviewed. No pertinent family history.  Review of Systems  Exam:   BP 118/66 (BP Location: Right Arm, Patient Position: Sitting, Cuff Size: Normal)   Pulse 84   Resp 16   Ht 5' 3.75" (1.619 m)   Wt 122 lb (55.3 kg)   LMP 08/11/2016   BMI 21.11 kg/m   Weight change: @WEIGHTCHANGE @ Height:   Height: 5' 3.75" (161.9 cm)  Ht Readings from Last 3 Encounters:  08/23/16 5' 3.75" (1.619 m)  03/16/14 5\' 3"  (1.6 m)  03/10/14 5\' 3"  (1.6 m)    General appearance: alert, cooperative and appears stated age Head: Normocephalic, without obvious abnormality, atraumatic Neck: no adenopathy, supple, symmetrical, trachea midline  and thyroid normal to inspection and palpation Lungs: clear to auscultation bilaterally Heart: regular rate and rhythm Abdomen: soft, non-tender; bowel sounds normal; no masses,  no organomegaly Extremities: extremities normal, atraumatic, no cyanosis or edema Skin: Skin color, texture, turgor normal. No rashes or lesions Lymph nodes: Cervical, supraclavicular nodes normal. No abnormal inguinal nodes palpated Neurologic: Grossly normal   Pelvic: External genitalia:  no lesions                            Bimanual Exam:  Done via rectal exam only. Vaginal exam was not done. Uterus:  normal size, contour,  position, consistency, mobility, non-tender and anteverted              Adnexa: no mass, fullness, tenderness               Anus:  normal sphincter tone, no lesions  The patient's friend was not present for the exam. She wanted him back in the room for discussion.   A:  Abdominal pain, generalized for the last 2 months for the week prior to and the week of her menses. Normal pelvic exam, normal pelvic CT. I'm not convinced that her generalized pain is from endometriosis. It's possible that the hormonal changes of her cycle are affecting her bowels  Previously normal CBC, Glucose, metabolic panel and TSH with Dr Loreta AveMann  Dysmenorrhea, primary and tolerable, different than her generalized pain  Menorrhagia, worsening in the last few months  Depression, anxiety. Discussed the option of seeing a counselor or medication, she declines. If she would like to try medication she will call  P:   Start OCP's, this will regulate her hormones (no contraindications, risks reviewed) and hopefully help control any cyclic pain. It should help her menorrhagia and dysmenorrhea  F/U in 3 months  Discussed coming for evaluation when the pain is severe  Can consider a GYN ultrasound (transabdominal only) with increased pain, yield is low with her negative CT   Anaprox for menstrual cramps not controlled with OCP's  CBC, HgbA1C  Discussed the possibility of laparoscopy if OCP's don't help and Dr Loreta AveMann doesn't feel her pain is GI related    CC: Dr Charna ElizabethJyothi Mann

## 2016-08-24 LAB — HEMOGLOBIN A1C
HEMOGLOBIN A1C: 5.1 % (ref <5.7)
Mean Plasma Glucose: 100 mg/dL

## 2016-08-24 NOTE — Telephone Encounter (Signed)
Patient scheduled for three month recheck on 11/29/16 with Dr. Oscar LaJertson. Routing to provider for FYI only and to close encounter.

## 2016-08-24 NOTE — Telephone Encounter (Signed)
Attempted to call patient but her voice mail box is full

## 2016-08-25 ENCOUNTER — Telehealth: Payer: Self-pay | Admitting: *Deleted

## 2016-08-25 NOTE — Telephone Encounter (Signed)
-----   Message from Romualdo BolkJill Evelyn Jertson, MD sent at 08/24/2016  5:08 PM EDT ----- Please inform the patient that she now has mild anemia and low iron stores. She should start taking one iron tablet a day. She could try slow fe, the problem is that the iron can be constipating. She can take magnesium 500 mg a day to prevent the constipation from the iron (in addition to the other medications she is on).  We can recheck her for anemia at her f/u visit.

## 2016-08-25 NOTE — Telephone Encounter (Signed)
Attempted to call patient. Unable to leave voicemail. Mailbox full.  

## 2016-08-30 NOTE — Telephone Encounter (Signed)
Patient notified verbalized understanding

## 2016-11-28 ENCOUNTER — Telehealth: Payer: Self-pay | Admitting: Obstetrics and Gynecology

## 2016-11-28 NOTE — Telephone Encounter (Signed)
Patient cancelled her 3 month recheck because [place] is [place]. Will call back to reschedule.

## 2016-11-29 ENCOUNTER — Ambulatory Visit: Payer: PPO | Admitting: Obstetrics and Gynecology

## 2017-12-21 ENCOUNTER — Encounter (HOSPITAL_COMMUNITY): Payer: Self-pay | Admitting: Emergency Medicine

## 2017-12-21 DIAGNOSIS — E860 Dehydration: Secondary | ICD-10-CM | POA: Diagnosis not present

## 2017-12-21 DIAGNOSIS — R197 Diarrhea, unspecified: Secondary | ICD-10-CM | POA: Insufficient documentation

## 2017-12-21 DIAGNOSIS — R112 Nausea with vomiting, unspecified: Secondary | ICD-10-CM | POA: Diagnosis not present

## 2017-12-21 LAB — COMPREHENSIVE METABOLIC PANEL
ALK PHOS: 40 U/L (ref 38–126)
ALT: 17 U/L (ref 14–54)
ANION GAP: 13 (ref 5–15)
AST: 21 U/L (ref 15–41)
Albumin: 4.1 g/dL (ref 3.5–5.0)
BUN: 10 mg/dL (ref 6–20)
CALCIUM: 9.4 mg/dL (ref 8.9–10.3)
CHLORIDE: 102 mmol/L (ref 101–111)
CO2: 22 mmol/L (ref 22–32)
Creatinine, Ser: 0.58 mg/dL (ref 0.44–1.00)
GFR calc non Af Amer: >60 mL/min (ref >60)
Glucose, Bld: 105 mg/dL — ABNORMAL HIGH (ref 65–99)
POTASSIUM: 3.3 mmol/L — AB (ref 3.5–5.1)
SODIUM: 137 mmol/L (ref 135–145)
Total Bilirubin: 0.5 mg/dL (ref 0.3–1.2)
Total Protein: 7.2 g/dL (ref 6.5–8.1)

## 2017-12-21 LAB — CBC
HCT: 35.1 % — ABNORMAL LOW (ref 36.0–46.0)
HEMOGLOBIN: 11.7 g/dL — AB (ref 12.0–15.0)
MCH: 28.7 pg (ref 26.0–34.0)
MCHC: 33.3 g/dL (ref 30.0–36.0)
MCV: 86.2 fL (ref 78.0–100.0)
Platelets: 265 10*3/uL (ref 150–400)
RBC: 4.07 MIL/uL (ref 3.87–5.11)
RDW: 13 % (ref 11.5–15.5)
WBC: 10.5 10*3/uL (ref 4.0–10.5)

## 2017-12-21 LAB — URINALYSIS, ROUTINE W REFLEX MICROSCOPIC
Bacteria, UA: NONE SEEN
Bilirubin Urine: NEGATIVE
GLUCOSE, UA: NEGATIVE mg/dL
KETONES UR: 80 mg/dL — AB
Leukocytes, UA: NEGATIVE
Nitrite: NEGATIVE
PROTEIN: 30 mg/dL — AB
Specific Gravity, Urine: 1.031 — ABNORMAL HIGH (ref 1.005–1.030)
pH: 5 (ref 5.0–8.0)

## 2017-12-21 LAB — LIPASE, BLOOD: Lipase: 19 U/L (ref 11–51)

## 2017-12-21 LAB — I-STAT BETA HCG BLOOD, ED (MC, WL, AP ONLY): I-stat hCG, quantitative: <5 m[IU]/mL (ref <5)

## 2017-12-21 MED ORDER — ONDANSETRON 4 MG PO TBDP
4.0000 mg | ORAL_TABLET | Freq: Once | ORAL | Status: AC | PRN
  Administered 2017-12-21: 4 mg via ORAL
  Filled 2017-12-21: qty 1

## 2017-12-21 NOTE — ED Triage Notes (Signed)
Patient presents ambulatory with husband c/o lower abdominal pain onset of this afternoon. Patient c/o N/V/D associated with lower back pain. Denies any urinary symptoms.

## 2017-12-22 ENCOUNTER — Emergency Department (HOSPITAL_COMMUNITY)
Admission: EM | Admit: 2017-12-22 | Discharge: 2017-12-22 | Disposition: A | Discharge status: Home / Self Care | Payer: PPO | Attending: Emergency Medicine | Admitting: Emergency Medicine

## 2017-12-22 DIAGNOSIS — R197 Diarrhea, unspecified: Secondary | ICD-10-CM

## 2017-12-22 DIAGNOSIS — R112 Nausea with vomiting, unspecified: Secondary | ICD-10-CM

## 2017-12-22 DIAGNOSIS — E860 Dehydration: Secondary | ICD-10-CM

## 2017-12-22 MED ORDER — SODIUM CHLORIDE 0.9 % IV BOLUS (SEPSIS)
1000.0000 mL | Freq: Once | INTRAVENOUS | Status: AC
  Administered 2017-12-22: 1000 mL via INTRAVENOUS

## 2017-12-22 MED ORDER — DICYCLOMINE HCL 10 MG/ML IM SOLN
20.0000 mg | Freq: Once | INTRAMUSCULAR | Status: AC
  Administered 2017-12-22: 20 mg via INTRAMUSCULAR
  Filled 2017-12-22: qty 2

## 2017-12-22 MED ORDER — DICYCLOMINE HCL 20 MG PO TABS: 20.0000 mg | ORAL_TABLET | Freq: Three times a day (TID) | ORAL | 0 refills | Status: DC

## 2017-12-22 MED ORDER — ONDANSETRON 4 MG PO TBDP: 4.0000 mg | ORAL_TABLET | Freq: Four times a day (QID) | ORAL | 0 refills | Status: AC | PRN

## 2017-12-22 MED ORDER — LOPERAMIDE HCL 2 MG PO CAPS: 2.0000 mg | ORAL_CAPSULE | Freq: Four times a day (QID) | ORAL | 0 refills | Status: AC | PRN

## 2017-12-22 MED ORDER — ONDANSETRON HCL 4 MG/2ML IJ SOLN
4.0000 mg | Freq: Once | INTRAMUSCULAR | Status: AC
  Administered 2017-12-22: 4 mg via INTRAVENOUS
  Filled 2017-12-22: qty 2

## 2017-12-22 NOTE — ED Notes (Signed)
ED Provider at bedside. 

## 2017-12-22 NOTE — ED Provider Notes (Signed)
TIME SEEN: 1:45 AM  CHIEF COMPLAINT: Abdominal pain, nausea, vomiting and diarrhea  HPI: Patient is a 32 year old female with history of depression and anxiety who presents to the emergency department with diffuse crampy abdominal pain, nausea, vomiting and diarrhea that started today.  No sick contacts or recent travel.  No recent antibiotic use or hospitalization.  Last menstrual period was February 10.  Has had previous appendectomy.  No dysuria, hematuria, vaginal bleeding or discharge.  ROS: See HPI Constitutional: no fever  Eyes: no drainage  ENT: no runny nose   Cardiovascular:  no chest pain  Resp: no SOB  GI: Diarrhea and vomiting GU: no dysuria Integumentary: no rash  Allergy: no hives  Musculoskeletal: no leg swelling  Neurological: no slurred speech ROS otherwise negative  PAST MEDICAL HISTORY/PAST SURGICAL HISTORY:  Past Medical History:  Diagnosis Date  . Depression   . Panic attack     MEDICATIONS:  Prior to Admission medications   Medication Sig Start Date End Date Taking? Authorizing Provider  naproxen sodium (ANAPROX DS) 550 MG tablet Take 1 tablet (550 mg total) by mouth 2 (two) times daily with a meal. Patient not taking: Reported on 12/22/2017 08/23/16   Romualdo Bolk, MD  norethindrone-ethinyl estradiol (JUNEL FE,GILDESS FE,LOESTRIN FE) 1-20 MG-MCG tablet Take 1 tablet by mouth daily. Patient not taking: Reported on 12/22/2017 08/23/16   Romualdo Bolk, MD    ALLERGIES:  No Known Allergies  SOCIAL HISTORY:  Social History   Tobacco Use  . Smoking status: Never Smoker  . Smokeless tobacco: Never Used  Substance Use Topics  . Alcohol use: No    FAMILY HISTORY: No family history on file.  EXAM: BP 113/66 (BP Location: Left Arm)   Pulse 73   Temp (!) 97.5 F (36.4 C) (Oral)   Resp 18   Ht 5\' 3"  (1.6 m)   Wt 50.8 kg (112 lb)   LMP 12/09/2017   SpO2 100%   BMI 19.84 kg/m  CONSTITUTIONAL: Alert and oriented and responds  appropriately to questions. Well-appearing; well-nourished HEAD: Normocephalic EYES: Conjunctivae clear, pupils appear equal, EOMI ENT: normal nose; moist mucous membranes NECK: Supple, no meningismus, no nuchal rigidity, no LAD  CARD: RRR; S1 and S2 appreciated; no murmurs, no clicks, no rubs, no gallops RESP: Normal chest excursion without splinting or tachypnea; breath sounds clear and equal bilaterally; no wheezes, no rhonchi, no rales, no hypoxia or respiratory distress, speaking full sentences ABD/GI: Normal bowel sounds; non-distended; soft, non-tender, no rebound, no guarding, no peritoneal signs, no hepatosplenomegaly BACK:  The back appears normal and is non-tender to palpation, there is no CVA tenderness EXT: Normal ROM in all joints; non-tender to palpation; no edema; normal capillary refill; no cyanosis, no calf tenderness or swelling    SKIN: Normal color for age and race; warm; no rash NEURO: Moves all extremities equally PSYCH: The patient's mood and manner are appropriate. Grooming and personal hygiene are appropriate.  MEDICAL DECISION MAKING: Patient here with likely viral gastritis.  Abdominal exam is benign.  Doubt cholecystitis, pancreatitis, bowel obstruction, diverticulitis, colitis.  She has had a previous appendectomy.  Labs unremarkable.  Urine shows large ketones and small amount of blood in her urine but she will give 2 L of IV fluids and treat symptomatically with Zofran, Imodium and Bentyl.  ED PROGRESS: Patient reports feeling much better.  Able to drink without difficulty.  Abdominal exam is still benign.  I feel she is safe to be discharged home.  Will discharge with Zofran and Bentyl.  Have advised her to use Imodium over-the-counter.  At this time, I do not feel there is any life-threatening condition present. I have reviewed and discussed all results (EKG, imaging, lab, urine as appropriate) and exam findings with patient/family. I have reviewed nursing notes  and appropriate previous records.  I feel the patient is safe to be discharged home without further emergent workup and can continue workup as an outpatient as needed. Discussed usual and customary return precautions. Patient/family verbalize understanding and are comfortable with this plan.  Outpatient follow-up has been provided if needed. All questions have been answered.      Allani Reber, Layla MawKristen N, DO 12/22/17 [phone]

## 2018-03-30 DIAGNOSIS — R197 Diarrhea, unspecified: Secondary | ICD-10-CM | POA: Diagnosis not present

## 2018-03-30 DIAGNOSIS — Z79899 Other long term (current) drug therapy: Secondary | ICD-10-CM | POA: Insufficient documentation

## 2018-03-30 DIAGNOSIS — R109 Unspecified abdominal pain: Secondary | ICD-10-CM | POA: Diagnosis present

## 2018-03-30 DIAGNOSIS — R1084 Generalized abdominal pain: Secondary | ICD-10-CM | POA: Diagnosis not present

## 2018-03-31 ENCOUNTER — Emergency Department (HOSPITAL_COMMUNITY)
Admission: EM | Admit: 2018-03-31 | Discharge: 2018-03-31 | Disposition: A | Discharge status: Home / Self Care | Payer: PPO | Attending: Emergency Medicine | Admitting: Emergency Medicine

## 2018-03-31 ENCOUNTER — Encounter (HOSPITAL_COMMUNITY): Payer: Self-pay | Admitting: Emergency Medicine

## 2018-03-31 ENCOUNTER — Emergency Department (HOSPITAL_COMMUNITY): Payer: PPO

## 2018-03-31 ENCOUNTER — Other Ambulatory Visit: Payer: Self-pay

## 2018-03-31 DIAGNOSIS — R1084 Generalized abdominal pain: Secondary | ICD-10-CM

## 2018-03-31 DIAGNOSIS — R197 Diarrhea, unspecified: Secondary | ICD-10-CM

## 2018-03-31 LAB — COMPREHENSIVE METABOLIC PANEL
ALBUMIN: 3.9 g/dL (ref 3.5–5.0)
ALK PHOS: 39 U/L (ref 38–126)
ALT: 97 U/L — ABNORMAL HIGH (ref 14–54)
ANION GAP: 8 (ref 5–15)
AST: 90 U/L — ABNORMAL HIGH (ref 15–41)
BILIRUBIN TOTAL: 0.5 mg/dL (ref 0.3–1.2)
BUN: 12 mg/dL (ref 6–20)
CALCIUM: 9.1 mg/dL (ref 8.9–10.3)
CO2: 22 mmol/L (ref 22–32)
Chloride: 109 mmol/L (ref 101–111)
Creatinine, Ser: 0.55 mg/dL (ref 0.44–1.00)
Glucose, Bld: 103 mg/dL — ABNORMAL HIGH (ref 65–99)
POTASSIUM: 4.1 mmol/L (ref 3.5–5.1)
Sodium: 139 mmol/L (ref 135–145)
TOTAL PROTEIN: 6.8 g/dL (ref 6.5–8.1)

## 2018-03-31 LAB — CBC
HEMATOCRIT: 32.3 % — AB (ref 36.0–46.0)
HEMOGLOBIN: 10.3 g/dL — AB (ref 12.0–15.0)
MCH: 27.2 pg (ref 26.0–34.0)
MCHC: 31.9 g/dL (ref 30.0–36.0)
MCV: 85.2 fL (ref 78.0–100.0)
Platelets: 224 10*3/uL (ref 150–400)
RBC: 3.79 MIL/uL — ABNORMAL LOW (ref 3.87–5.11)
RDW: 14.2 % (ref 11.5–15.5)
WBC: 5 10*3/uL (ref 4.0–10.5)

## 2018-03-31 LAB — URINALYSIS, ROUTINE W REFLEX MICROSCOPIC
BACTERIA UA: NONE SEEN
BILIRUBIN URINE: NEGATIVE
GLUCOSE, UA: NEGATIVE mg/dL
Ketones, ur: NEGATIVE mg/dL
LEUKOCYTES UA: NEGATIVE
NITRITE: NEGATIVE
PROTEIN: NEGATIVE mg/dL
SPECIFIC GRAVITY, URINE: 1.011 (ref 1.005–1.030)
pH: 6 (ref 5.0–8.0)

## 2018-03-31 LAB — LIPASE, BLOOD: Lipase: 32 U/L (ref 11–51)

## 2018-03-31 LAB — I-STAT BETA HCG BLOOD, ED (MC, WL, AP ONLY)

## 2018-03-31 MED ORDER — METOCLOPRAMIDE HCL 5 MG/ML IJ SOLN
10.0000 mg | Freq: Once | INTRAMUSCULAR | Status: AC
  Administered 2018-03-31: 10 mg via INTRAVENOUS
  Filled 2018-03-31: qty 2

## 2018-03-31 MED ORDER — SODIUM CHLORIDE 0.9 % IV BOLUS
1000.0000 mL | Freq: Once | INTRAVENOUS | Status: AC
  Administered 2018-03-31: 1000 mL via INTRAVENOUS

## 2018-03-31 MED ORDER — METOCLOPRAMIDE HCL 10 MG PO TABS: 10.0000 mg | ORAL_TABLET | Freq: Four times a day (QID) | ORAL | 0 refills | Status: AC | PRN

## 2018-03-31 NOTE — ED Triage Notes (Signed)
Patient complaining of upper abdominal pain. Patient states it started about none. Patient states she had diarrhea.

## 2018-03-31 NOTE — Discharge Instructions (Signed)
Your labs and ultrasound performed for recurrent abdominal pain and loose stool are normal. You can be discharged home and will need further evaluation by Dr. Loreta AveMann, gastroenterology. Return here with any high fever, severe pain, bloody stools or uncontrolled vomiting. Take reglan for abdominal cramping as prescribed.

## 2018-03-31 NOTE — ED Provider Notes (Signed)
[facility]-EMERGENCY DEPT Provider Note   CSN: [id number] Arrival date & time: 03/30/18  2353     History   Chief Complaint Chief Complaint  Patient presents with  . Abdominal Pain    HPI Bailey Ballard is a 32 y.o. female.  Patient presents with symptoms abdominal cramping, diarrhea, that started today. She states these symptoms occur recurrently, last episode was 2 months ago. No identified cause has been determined. She was seen once by GI 2 years ago and has not returned for further evaluation. No fever, no vomiting.   The history is provided by the patient. No language interpreter was used.  Abdominal Pain   This is a recurrent problem. Associated symptoms include diarrhea (One loose stool earlier today). Pertinent negatives include fever and myalgias.    Past Medical History:  Diagnosis Date  . Depression   . Panic attack     Patient Active Problem List   Diagnosis Date Noted  . Chest pain, atypical 03/16/2014  . Heart murmur 03/16/2014    Past Surgical History:  Procedure Laterality Date  . APPENDECTOMY       OB History    Gravida  0   Para  0   Term  0   Preterm  0   AB  0   Living  0     SAB  0   TAB  0   Ectopic  0   Multiple  0   Live Births               Home Medications    Prior to Admission medications   Medication Sig Start Date End Date Taking? Authorizing Provider  dicyclomine (BENTYL) 20 MG tablet Take 1 tablet (20 mg total) by mouth 3 (three) times daily before meals. As needed for abdominal pain 12/22/17   Ward, Layla MawKristen N, DO  loperamide (IMODIUM) 2 MG capsule Take 1 capsule (2 mg total) by mouth 4 (four) times daily as needed for diarrhea or loose stools. 12/22/17   Ward, Layla MawKristen N, DO  naproxen sodium (ANAPROX DS) 550 MG tablet Take 1 tablet (550 mg total) by mouth 2 (two) times daily with a meal. Patient not taking: Reported on 12/22/2017 08/23/16   Romualdo BolkJertson, Jill Evelyn, MD  norethindrone-ethinyl  estradiol (JUNEL FE,GILDESS FE,LOESTRIN FE) 1-20 MG-MCG tablet Take 1 tablet by mouth daily. Patient not taking: Reported on 12/22/2017 08/23/16   Romualdo BolkJertson, Jill Evelyn, MD  ondansetron (ZOFRAN ODT) 4 MG disintegrating tablet Take 1 tablet (4 mg total) by mouth every 6 (six) hours as needed for nausea or vomiting. 12/22/17   Ward, Layla MawKristen N, DO    Family History History reviewed. No pertinent family history.  Social History Social History   Tobacco Use  . Smoking status: Never Smoker  . Smokeless tobacco: Never Used  Substance Use Topics  . Alcohol use: No  . Drug use: No     Allergies   Patient has no known allergies.   Review of Systems Review of Systems  Constitutional: Negative for chills and fever.  Respiratory: Negative.   Cardiovascular: Negative.   Gastrointestinal: Positive for abdominal pain and diarrhea (One loose stool earlier today).  Genitourinary: Negative.   Musculoskeletal: Negative.  Negative for back pain and myalgias.  Skin: Negative.   Neurological: Negative.  Negative for syncope and weakness.     Physical Exam Updated Vital Signs BP 113/65 (BP Location: Left Arm)   Pulse 73   Temp 97.9 F (36.6 C) (Oral)  Resp 18   Ht 5\' 3"  (1.6 m)   Wt 51.3 kg (113 lb)   LMP 03/03/2018   SpO2 100%   BMI 20.02 kg/m   Physical Exam  Constitutional: She appears well-developed and well-nourished.  HENT:  Head: Normocephalic.  Neck: Normal range of motion. Neck supple.  Cardiovascular: Normal rate and regular rhythm.  Pulmonary/Chest: Effort normal and breath sounds normal.  Abdominal: Soft. Bowel sounds are normal. There is tenderness (Mild) in the epigastric area. There is no rebound and no guarding.  Musculoskeletal: Normal range of motion.  Neurological: She is alert. No cranial nerve deficit.  Skin: Skin is warm and dry. No rash noted.  Psychiatric: She has a normal mood and affect.     ED Treatments / Results  Labs (all labs ordered are  listed, but only abnormal results are displayed) Labs Reviewed  COMPREHENSIVE METABOLIC PANEL - Abnormal; Notable for the following components:      Result Value   Glucose, Bld 103 (*)    AST 90 (*)    ALT 97 (*)    All other components within normal limits  CBC - Abnormal; Notable for the following components:   RBC 3.79 (*)    Hemoglobin 10.3 (*)    HCT 32.3 (*)    All other components within normal limits  URINALYSIS, ROUTINE W REFLEX MICROSCOPIC - Abnormal; Notable for the following components:   Hgb urine dipstick SMALL (*)    All other components within normal limits  LIPASE, BLOOD  I-STAT BETA HCG BLOOD, ED ([facility], [facility], [facility] ONLY)    EKG None  Radiology No results found.  Procedures Procedures (including critical care time)  Medications Ordered in ED Medications  sodium chloride 0.9 % bolus 1,000 mL (has no administration in time range)  metoCLOPramide (REGLAN) injection 10 mg (has no administration in time range)     Initial Impression / Assessment and Plan / ED Course  I have reviewed the triage vital signs and the nursing notes.  Pertinent labs & imaging results that were available during my care of the patient were reviewed by me and considered in my medical decision making (see chart for details).     Patient presents with recurrent symptoms of abdominal pain and diarrhea. Symptoms occur every couple of months.   Chart reviewed. Last seen by GI almost 2 years ago for single evaluation. Negative CT scan at that time.   Labs are essentially unremarkable with exception of mild leukocytosis and transaminitis. Re-evaluation of the patient finds her persistently tender in the epigastrium as well as the RUQ abdomen. She is able to relate a pattern of pain onset as postprandial. Will get RUQ ultrasound to evaluation gall bladder. Pain is improved.   RUQ [place] negative for acute findings. She is feeling better after medication (Reglan) and IV fluids. Ok for discharge home.  Will refer back to Dr. Loreta Ave for further evaluation of recurrent symptoms.   Final Clinical Impressions(s) / ED Diagnoses   Final diagnoses:  None   1. Abdominal pain, recurrent 2. Diarrhea   ED Discharge Orders    None       Elpidio Anis, Cordelia Poche 03/31/18 1610    Zadie Rhine, MD 04/01/18 [phone]

## 2018-07-15 IMAGING — CT CT ABD-PELV W/ CM
2 of 4 series · 11 of 36 positions shown, 18 images · IV contrast (READICAT/WATER & [ID] ISOVUE 300)
Comparison: Lumbar spine radiograph 01/08/2014

CLINICAL DATA: Abdominal pain for 1 month with chronic constipation

EXAM:
CT ABDOMEN AND PELVIS WITH CONTRAST
TECHNIQUE: Multidetector CT imaging of the abdomen and pelvis was performed
using the standard protocol following bolus administration of
intravenous contrast.
CONTRAST:  100 mL Ssovue-KSS IV

[Series 3: abd/pelvis with · axial · 0.60mm/px · z∈[-412,-42]mm · 10 of 92 slices shown, 16 images]
[im 9/92  soft-tissue]
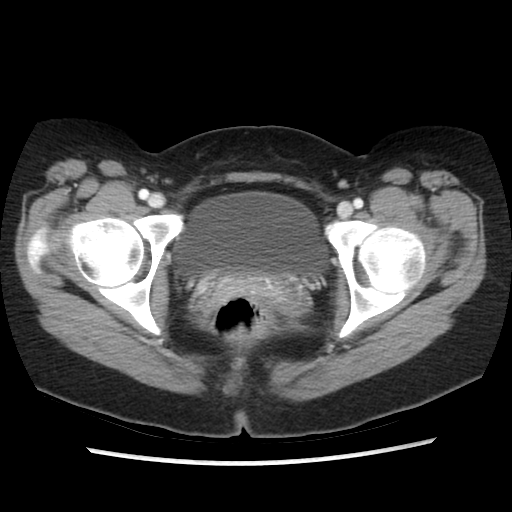
[im 9/92  bone]
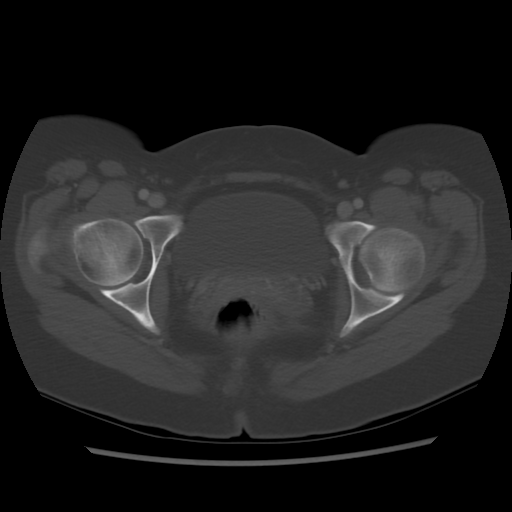
[im 17/92  soft-tissue]
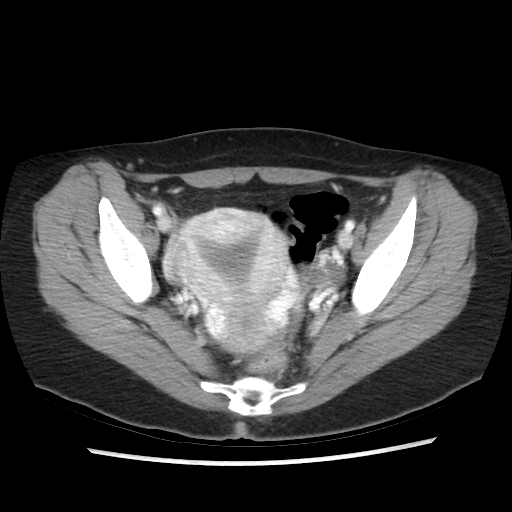
[im 25/92  soft-tissue]
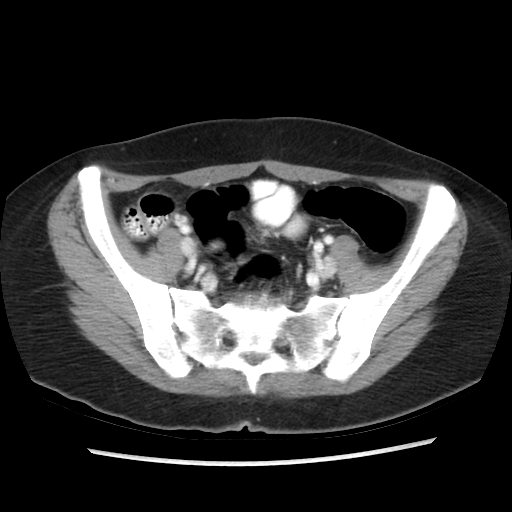
[im 34/92  soft-tissue]
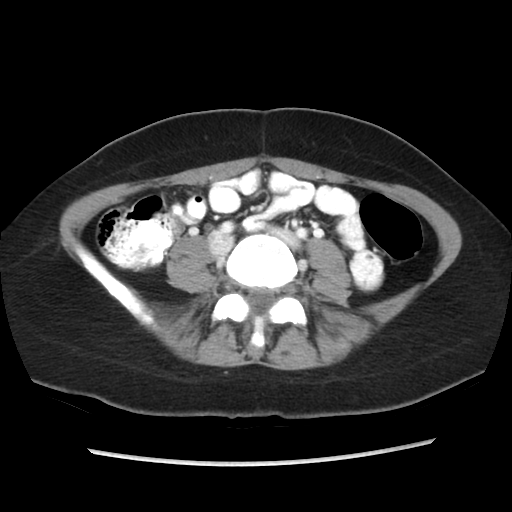
[im 42/92  soft-tissue]
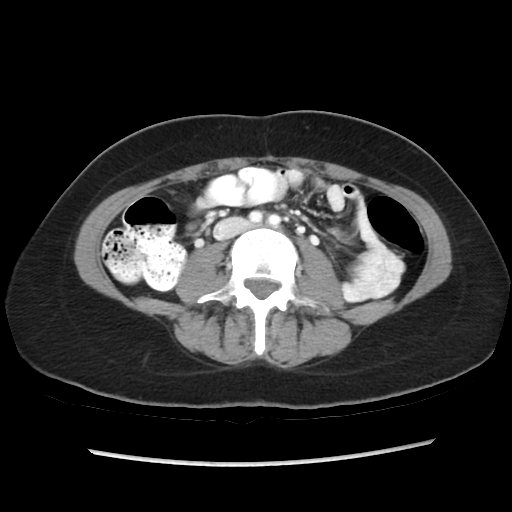
[im 50/92  soft-tissue]
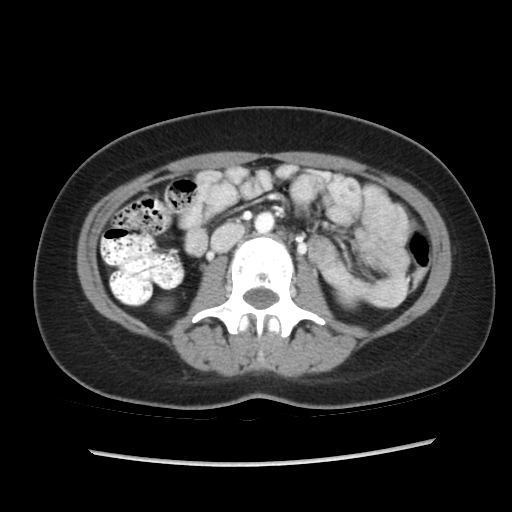
[im 58/92  soft-tissue]
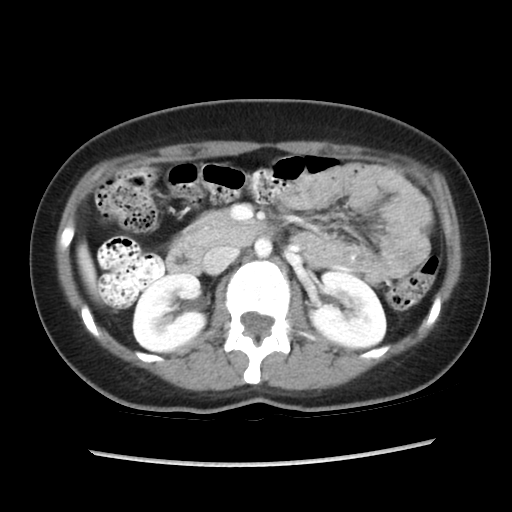
[im 58/92  lung]
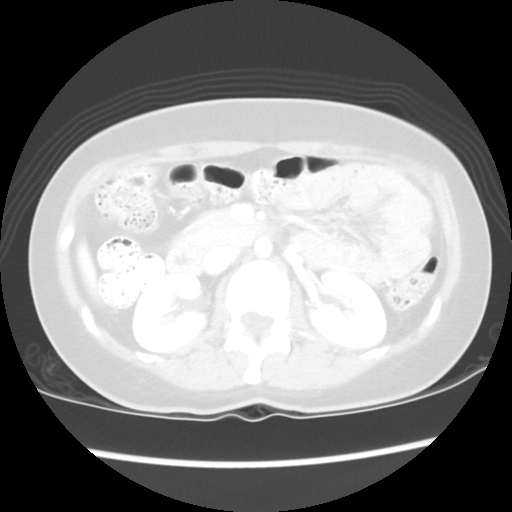
[im 67/92  soft-tissue]
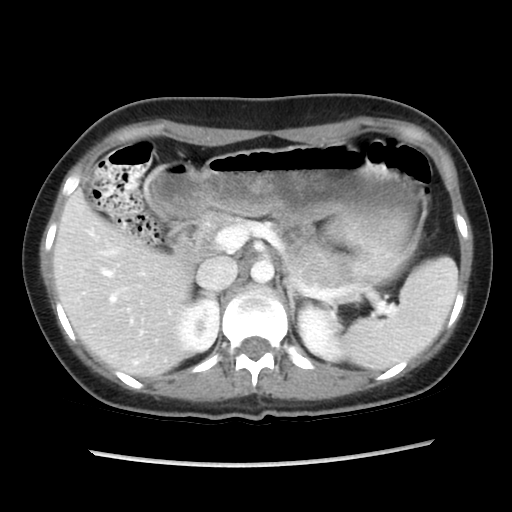
[im 67/92  lung]
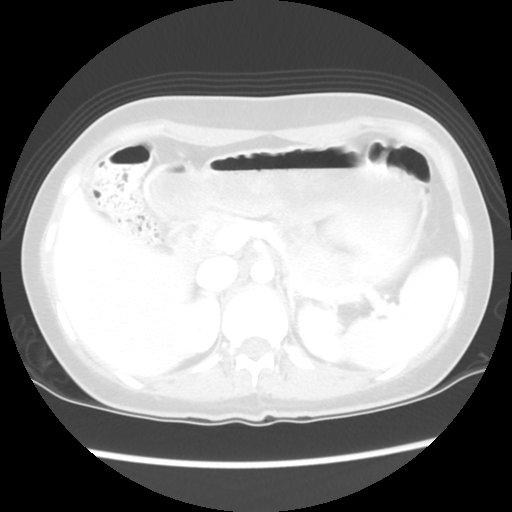
[im 75/92  soft-tissue]
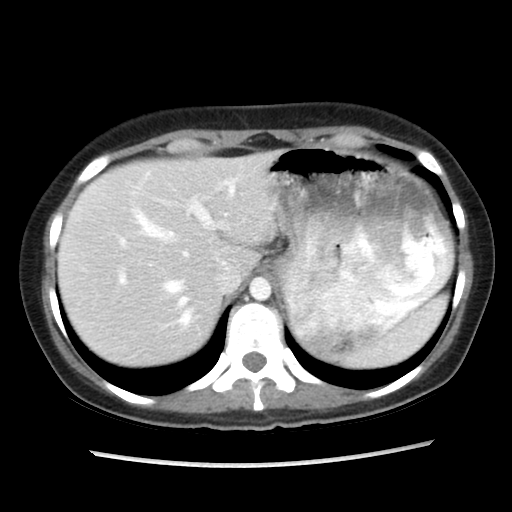
[im 75/92  lung]
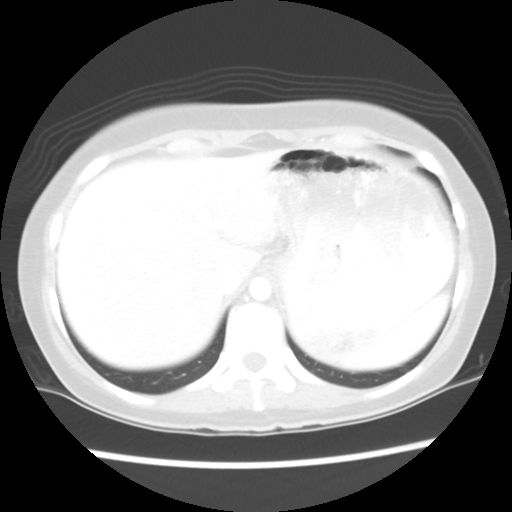
[im 75/92  bone]
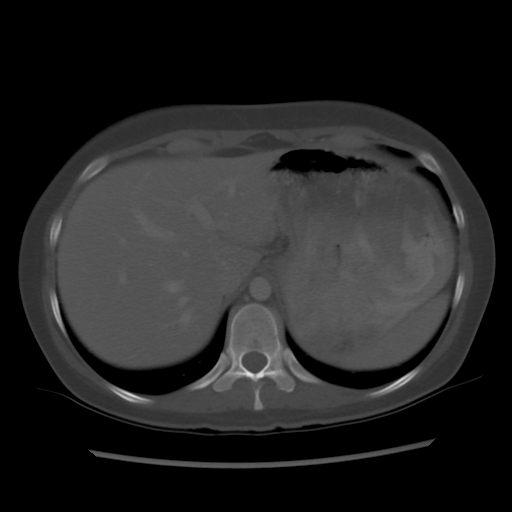
[im 83/92  soft-tissue]
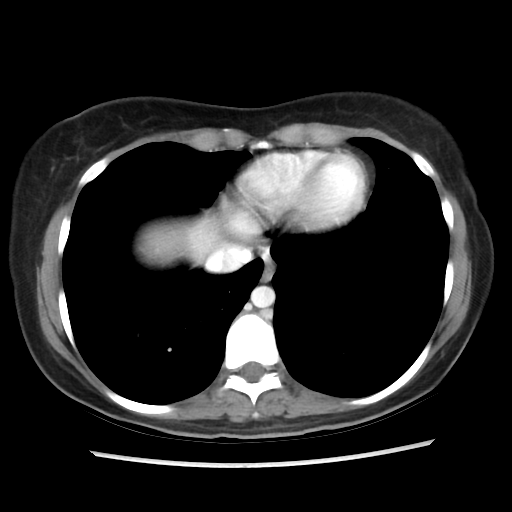
[im 83/92  lung]
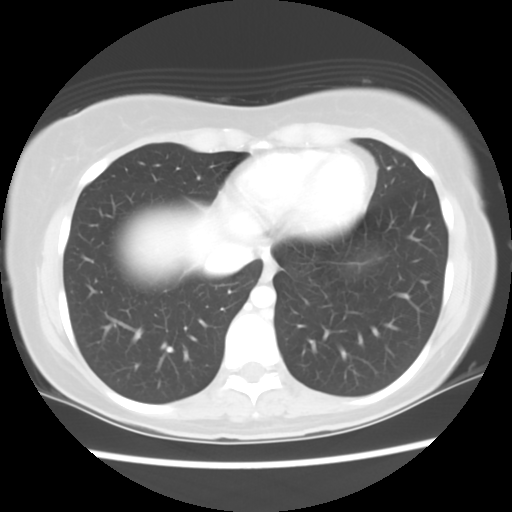

[Series 601: coronal body · coronal · 0.85mm/px · 1 of 90 slices shown, 2 images]
[im 30/90  soft-tissue]
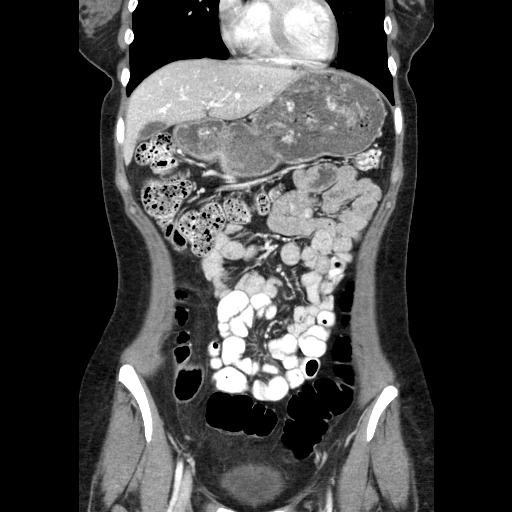
[im 30/90  bone]
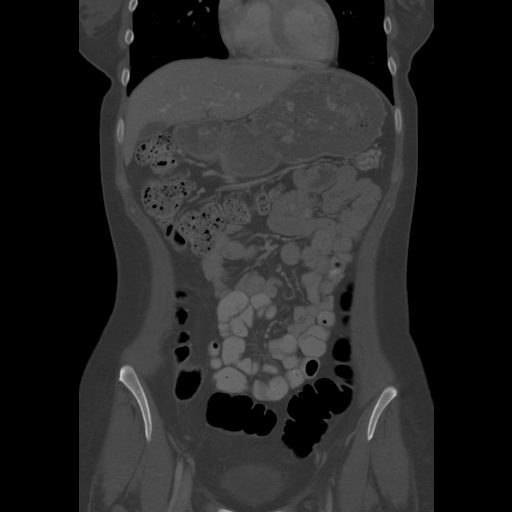

[11 of 36 positions shown; findings below may reference images not displayed]

FINDINGS: Lower chest: No pulmonary nodules. No visible pleural or pericardial
effusion.

Hepatobiliary: Normal hepatic size and contours without focal liver
lesion. No perihepatic ascites. No intra- or extrahepatic biliary
dilatation. Normal gallbladder.

Pancreas: Normal pancreatic contours and enhancement. No
peripancreatic fluid collection or pancreatic ductal dilatation.

Spleen: Normal.

Adrenals/Urinary Tract: Normal adrenal glands. No hydronephrosis or
solid renal mass.

Stomach/Bowel: No abnormal bowel dilatation. No bowel wall
thickening or adjacent fat stranding to indicate acute inflammation.
No abdominal fluid collection. The appendix is surgically absent.

Vascular/Lymphatic: Normal course and caliber of the major abdominal
vessels. No abdominal or pelvic adenopathy.

Reproductive: There is variant uterine morphology, most consistent
with arcuate uterus. Left corpus luteum is noted. Normal right
ovary.

Musculoskeletal: No lytic or blastic osseous lesion. Normal
visualized extrathoracic and extraperitoneal soft tissues.

Other: No contributory non-categorized findings.
IMPRESSION: 1. No acute abnormality of the abdomen or pelvis.
2. Incidentally noted variant uterine anatomy, most consistent with
arcuate uterus.
